# Patient Record
Sex: Male | Born: 1960 | Race: White | Hispanic: No | Marital: Married | Smoking: Never smoker
Health system: Southern US, Community
[De-identification: ages and names within clinical notes are randomized; demographics above are authoritative.]

## PROBLEM LIST (undated history)

## (undated) DIAGNOSIS — K219 Gastro-esophageal reflux disease without esophagitis: Secondary | ICD-10-CM

## (undated) HISTORY — DX: Gastro-esophageal reflux disease without esophagitis: K21.9

## (undated) HISTORY — PX: KNEE ARTHROSCOPY: SUR90

---

## 2000-12-01 ENCOUNTER — Ambulatory Visit (HOSPITAL_COMMUNITY): Admission: RE | Admit: 2000-12-01 | Discharge: 2000-12-01 | Payer: Self-pay

## 2010-09-14 LAB — HM COLONOSCOPY

## 2014-02-10 ENCOUNTER — Ambulatory Visit: Payer: Self-pay | Admitting: Internal Medicine

## 2014-02-10 ENCOUNTER — Telehealth: Payer: Self-pay | Admitting: *Deleted

## 2014-02-10 NOTE — Telephone Encounter (Signed)
Pt did not show for appointment 02/10/2014 at 3pm for new patient transfer, family member sees Dr. Beverely Lowabori

## 2014-02-10 NOTE — Telephone Encounter (Signed)
Please reschedule if patient agreeable

## 2014-02-18 NOTE — Telephone Encounter (Signed)
Pt has rescheduled new patient appointment with Drue NovelPaz 03/04/2014

## 2014-03-04 ENCOUNTER — Ambulatory Visit: Payer: Self-pay | Admitting: Internal Medicine

## 2014-03-31 ENCOUNTER — Ambulatory Visit: Payer: Self-pay | Admitting: Internal Medicine

## 2014-04-10 ENCOUNTER — Ambulatory Visit (INDEPENDENT_AMBULATORY_CARE_PROVIDER_SITE_OTHER): Payer: 59 | Admitting: Internal Medicine

## 2014-04-10 ENCOUNTER — Ambulatory Visit: Payer: Self-pay | Admitting: Internal Medicine

## 2014-04-10 ENCOUNTER — Encounter: Payer: Self-pay | Admitting: Internal Medicine

## 2014-04-10 ENCOUNTER — Ambulatory Visit (INDEPENDENT_AMBULATORY_CARE_PROVIDER_SITE_OTHER): Payer: 59

## 2014-04-10 VITALS — BP 129/74 | HR 57 | Temp 98.1°F | Ht 74.0 in | Wt 254.0 lb

## 2014-04-10 DIAGNOSIS — L989 Disorder of the skin and subcutaneous tissue, unspecified: Secondary | ICD-10-CM

## 2014-04-10 DIAGNOSIS — K219 Gastro-esophageal reflux disease without esophagitis: Secondary | ICD-10-CM | POA: Insufficient documentation

## 2014-04-10 DIAGNOSIS — Z0000 Encounter for general adult medical examination without abnormal findings: Secondary | ICD-10-CM | POA: Insufficient documentation

## 2014-04-10 DIAGNOSIS — R002 Palpitations: Secondary | ICD-10-CM

## 2014-04-10 DIAGNOSIS — Z23 Encounter for immunization: Secondary | ICD-10-CM

## 2014-04-10 NOTE — Assessment & Plan Note (Addendum)
Td 2013 Flu shot today  Reports a colonoscopy 2014, asked patient to bring records DRE normal, check a PSA Diet and exercise discussed  Other issues: Skin lesions left ear, refer to dermatology

## 2014-04-10 NOTE — Progress Notes (Signed)
Pre visit review using our clinic review tool, if applicable. No additional management support is needed unless otherwise documented below in the visit note. 

## 2014-04-10 NOTE — Patient Instructions (Addendum)
Stop by the front desk and schedule labs to be done within few days (fasting) Please request an EKG to be done at the time of her lab appointment.  Please come back to the office in 3 months  for a routine check up   Will wait for the records from her previous doctors.  If the palpitations are intense, frequent or severe, call the office or go to the ER.

## 2014-04-10 NOTE — Assessment & Plan Note (Addendum)
Occasional episodes of palpitations and anxiety, not other symptoms. Pt believes the trigger is anxiety as that starts the episodes EKG--unavailable today due to a machine malfunction, asked to come back next week, Return to the office 3 months, call if symptoms severe

## 2014-04-10 NOTE — Assessment & Plan Note (Signed)
Currently on Nexium, works better than Prilosec. Reports a EGD 2014, will get records.

## 2014-04-10 NOTE — Progress Notes (Signed)
   Subjective:    Patient ID: Russell Mack, male    DOB: 1961/03/25, 54 y.o.   MRN: [id number]  DOS:  04/10/2014 Type of visit - description : New patient, transferring from Dr. Jeanie Seweredding, physical exam. Interval history: In general feeling well. Has a skin lesion of the left ear that is not healing.   ROS Denies chest pain or difficulty breathing No nausea, vomiting, diarrhea or blood in the stools GERD symptoms well controlled, no dysphagia or odynophagia. Over the last year from time to time he gets really anxious, symptoms are at rest usually, associated with palpitations but no other symptoms such as chest pain. No depression No dysuria, gross hematuria difficulty urinating   Past Medical History  Diagnosis Date  . GERD (gastroesophageal reflux disease)     History reviewed. No pertinent past surgical history.  History   Social History  . Marital Status: Married    Spouse Name: N/A    Number of Children: N/A  . Years of Education: N/A   Occupational History  . Not on file.   Social History Main Topics  . Smoking status: Never Smoker   . Smokeless tobacco: Not on file  . Alcohol Use: No  . Drug Use: No  . Sexual Activity: Not on file   Other Topics Concern  . Not on file   Social History Narrative  . No narrative on file     Family History  Problem Relation Age of Onset  . Diabetes Mother     mother, 2 sisters, aunts   . CAD Mother     CABG at age ~ 8160s  . CAD Sister     CABG, ~late 8850s  . Colon cancer Neg Hx   . Prostate cancer Neg Hx        Medication List       This list is accurate as of: 04/10/14 10:38 AM.  Always use your most recent med list.               NEXIUM PO  Take 1 tablet by mouth daily.           Objective:   Physical Exam BP 129/74 mmHg  Pulse 57  Temp(Src) 98.1 F (36.7 C) (Oral)  Ht 6\' 2"  (1.88 m)  Wt 254 lb (115.214 kg)  BMI 32.60 kg/m2  SpO2 98% General -- alert, well-developed, NAD.  Neck --no thyromegaly  , normal carotid pulse  HEENT-- Not pale.  Left ear, has had 2 mm scaly lesion TMs wnl  Lungs -- normal respiratory effort, no intercostal retractions, no accessory muscle use, and normal breath sounds.  Heart-- normal rate, regular rhythm, no murmur.  Abdomen-- Not distended, good bowel sounds,soft, non-tender. No rebound or rigidity.   Rectal-- No external abnormalities noted. Normal sphincter tone. No rectal masses or tenderness. Stool brown  Prostate--Prostate gland firm and smooth, no enlargement, nodularity, tenderness, mass, asymmetry or induration. Extremities-- no pretibial edema bilaterally  Neurologic--  alert & oriented X3. Speech normal, gait appropriate for age, strength symmetric and appropriate for age.  Psych-- Cognition and judgment appear intact. Cooperative with normal attention span and concentration. No anxious or depressed appearing.        Assessment & Plan:

## 2014-04-11 ENCOUNTER — Other Ambulatory Visit (INDEPENDENT_AMBULATORY_CARE_PROVIDER_SITE_OTHER): Payer: 59

## 2014-04-11 DIAGNOSIS — Z0000 Encounter for general adult medical examination without abnormal findings: Secondary | ICD-10-CM

## 2014-04-11 LAB — CBC WITH DIFFERENTIAL/PLATELET
Basophils Absolute: 0 10*3/uL (ref 0.0–0.1)
Basophils Relative: 0.4 % (ref 0.0–3.0)
Eosinophils Absolute: 0.1 10*3/uL (ref 0.0–0.7)
Eosinophils Relative: 1.3 % (ref 0.0–5.0)
HCT: 46.9 % (ref 39.0–52.0)
Hemoglobin: 15.4 g/dL (ref 13.0–17.0)
Lymphocytes Relative: 19.2 % (ref 12.0–46.0)
Lymphs Abs: 1.1 10*3/uL (ref 0.7–4.0)
MCHC: 33 g/dL (ref 30.0–36.0)
MCV: 91.2 fl (ref 78.0–100.0)
Monocytes Absolute: 0.6 10*3/uL (ref 0.1–1.0)
Monocytes Relative: 9.5 % (ref 3.0–12.0)
Neutro Abs: 4.1 10*3/uL (ref 1.4–7.7)
Neutrophils Relative %: 69.6 % (ref 43.0–77.0)
Platelets: 194 10*3/uL (ref 150.0–400.0)
RBC: 5.14 Mil/uL (ref 4.22–5.81)
RDW: 13.2 % (ref 11.5–15.5)
WBC: 5.9 10*3/uL (ref 4.0–10.5)

## 2014-04-11 LAB — COMPREHENSIVE METABOLIC PANEL
ALT: 31 U/L (ref 0–53)
AST: 22 U/L (ref 0–37)
Albumin: 3.9 g/dL (ref 3.5–5.2)
Alkaline Phosphatase: 93 U/L (ref 39–117)
BUN: 17 mg/dL (ref 6–23)
CO2: 25 mEq/L (ref 19–32)
Calcium: 8.8 mg/dL (ref 8.4–10.5)
Chloride: 107 mEq/L (ref 96–112)
Creatinine, Ser: 0.7 mg/dL (ref 0.4–1.5)
GFR: 116.38 mL/min (ref >60.00)
Glucose, Bld: 105 mg/dL — ABNORMAL HIGH (ref 70–99)
Potassium: 4.1 mEq/L (ref 3.5–5.1)
Sodium: 138 mEq/L (ref 135–145)
Total Bilirubin: 0.7 mg/dL (ref 0.2–1.2)
Total Protein: 6.7 g/dL (ref 6.0–8.3)

## 2014-04-11 LAB — TSH: TSH: 0.8 u[IU]/mL (ref 0.35–4.50)

## 2014-04-11 LAB — LIPID PANEL
Cholesterol: 179 mg/dL (ref 0–200)
HDL: 38.6 mg/dL — ABNORMAL LOW (ref >39.00)
LDL Cholesterol: 124 mg/dL — ABNORMAL HIGH (ref 0–99)
NonHDL: 140.4
Total CHOL/HDL Ratio: 5
Triglycerides: 84 mg/dL (ref 0.0–149.0)
VLDL: 16.8 mg/dL (ref 0.0–40.0)

## 2014-04-11 LAB — PSA: PSA: 1.78 ng/mL (ref 0.10–4.00)

## 2014-04-19 ENCOUNTER — Encounter: Payer: Self-pay | Admitting: Internal Medicine

## 2014-04-23 ENCOUNTER — Encounter: Payer: Self-pay | Admitting: Internal Medicine

## 2014-04-23 ENCOUNTER — Ambulatory Visit (INDEPENDENT_AMBULATORY_CARE_PROVIDER_SITE_OTHER): Payer: 59 | Admitting: Internal Medicine

## 2014-04-23 VITALS — BP 110/70 | HR 63 | Temp 98.1°F | Ht 74.0 in | Wt 233.4 lb

## 2014-04-23 DIAGNOSIS — Z8249 Family history of ischemic heart disease and other diseases of the circulatory system: Secondary | ICD-10-CM

## 2014-04-23 DIAGNOSIS — F419 Anxiety disorder, unspecified: Secondary | ICD-10-CM

## 2014-04-23 DIAGNOSIS — R002 Palpitations: Secondary | ICD-10-CM

## 2014-04-23 MED ORDER — ESCITALOPRAM OXALATE 10 MG PO TABS: 10.0000 mg | ORAL_TABLET | Freq: Every day | ORAL | Status: DC

## 2014-04-23 MED ORDER — ATORVASTATIN CALCIUM 20 MG PO TABS: 20.0000 mg | ORAL_TABLET | Freq: Every day | ORAL | Status: DC

## 2014-04-23 NOTE — Assessment & Plan Note (Addendum)
Patient presents today for a EKG but is noted to be very anxious regards his family history of CAD,. On further questioning, he has been anxious for a while, his wife believes that he "needs something" because he is very irritable and "short fuse". I suspect he has GAD, chronic. We talk about treatment modalities including as needed medication, counseling and daily medication. He does not like to take anything that "could be addictive". Plan: We agreed on  Lexapro, watch for side effects, come back in 2 months.

## 2014-04-23 NOTE — Assessment & Plan Note (Signed)
The patient has a strong family history heart disease: his parents and  3 out of the 5 siblings had CABGs, one sibling had a stent, all  in their 4750s. The patient's 89, has no known CAD. Plan: Will be more aggressive in controlling cardiovascular risk factors, start aspirin, start Lipitor; last LDL was 124, will set a goal of 100 or less. Check a stress test. F/u 2 months

## 2014-04-23 NOTE — Patient Instructions (Signed)
Start taking an aspirin every morning Take Lipitor at bedtime for cholesterol They Lexapro at bedtime for anxiety  Come back in 2 months, fasting for a checkup.  Please call if you have side effects

## 2014-04-23 NOTE — Progress Notes (Signed)
   Subjective:    Patient ID: Russell Mack, male    DOB: [birth year], 54 y.o.   MRN: [id number]  DOS:  04/23/2014 Type of visit - description : Here for a EKG Interval history: Patient was here for EKG but he was noted to be  extremely anxious. His sister had a CABG yesterday, she is 4958.   the patient is even more concerned now about his family history of heart disease. See assessment and plan.    ROS Denies chest pain, still has occasional palpitations No lower extremity edema, dizziness or sweats  Past Medical History  Diagnosis Date  . GERD (gastroesophageal reflux disease)     Past Surgical History  Procedure Laterality Date  . Knee arthroscopy  remotely, side ?    History   Social History  . Marital Status: Married    Spouse Name: N/A    Number of Children: 2  . Years of Education: N/A   Occupational History  . foreman    Social History Main Topics  . Smoking status: Never Smoker   . Smokeless tobacco: Never Used  . Alcohol Use: No  . Drug Use: No  . Sexual Activity: Not on file   Other Topics Concern  . Not on file   Social History Narrative   2 adult children, 2 G-kids        Medication List       This list is accurate as of: 04/23/14  3:16 PM.  Always use your most recent med list.               aspirin 81 MG tablet  Take 81 mg by mouth daily.     atorvastatin 20 MG tablet  Commonly known as:  LIPITOR  Take 1 tablet (20 mg total) by mouth daily.     escitalopram 10 MG tablet  Commonly known as:  LEXAPRO  Take 1 tablet (10 mg total) by mouth daily.     NEXIUM PO  Take 1 tablet by mouth daily.           Objective:   Physical Exam BP 110/70 mmHg  Pulse 63  Temp(Src) 98.1 F (36.7 C) (Oral)  Ht 6\' 2"  (1.88 m)  Wt 233 lb 6 oz (105.858 kg)  BMI 29.95 kg/m2  SpO2 97%  General -- alert, well-developed, NAD.  Lungs -- normal respiratory effort, no intercostal retractions, no accessory muscle use, and normal breath sounds.    Heart-- normal rate, regular rhythm, no murmur. Extremities-- no pretibial edema bilaterally  Neurologic--  alert & oriented X3. Speech normal, gait appropriate for age, strength symmetric and appropriate for age.  Psych-- Cognition and judgment appear intact. Cooperative with normal attention span and concentration. Very  anxious or depressed appearing.        Assessment & Plan:

## 2014-04-23 NOTE — Progress Notes (Signed)
Pre visit review using our clinic review tool, if applicable. No additional management support is needed unless otherwise documented below in the visit note. 

## 2014-07-10 ENCOUNTER — Ambulatory Visit: Payer: 59 | Admitting: Internal Medicine

## 2014-07-25 ENCOUNTER — Ambulatory Visit: Payer: 59 | Admitting: Internal Medicine

## 2014-09-03 ENCOUNTER — Other Ambulatory Visit: Payer: Self-pay | Admitting: Internal Medicine

## 2014-09-16 ENCOUNTER — Other Ambulatory Visit: Payer: Self-pay | Admitting: Internal Medicine

## 2014-10-01 ENCOUNTER — Telehealth: Payer: Self-pay | Admitting: Internal Medicine

## 2014-10-01 MED ORDER — ATORVASTATIN CALCIUM 20 MG PO TABS: 20.0000 mg | ORAL_TABLET | Freq: Every day | ORAL | Status: DC

## 2014-10-01 MED ORDER — ESCITALOPRAM OXALATE 10 MG PO TABS: 10.0000 mg | ORAL_TABLET | Freq: Every day | ORAL | Status: DC

## 2014-10-01 NOTE — Telephone Encounter (Signed)
[facility]. #30 tabs and 1 refill.

## 2014-10-01 NOTE — Telephone Encounter (Signed)
Caller name: Noland Fordyceonya Austill Relationship to patient: self Can be reached: [phone] [facility] [place]  Reason for call: Pt needing refills of Lexapro & Lipitor. Insurance company is requiring all meds sent to [facility] now. He has a few days of one med and about 2 weeks of the other. Appt scheduled for 10/15/14. Was due for appt in March.

## 2014-10-15 ENCOUNTER — Ambulatory Visit (INDEPENDENT_AMBULATORY_CARE_PROVIDER_SITE_OTHER): Payer: 59 | Admitting: Internal Medicine

## 2014-10-15 ENCOUNTER — Encounter: Payer: Self-pay | Admitting: Internal Medicine

## 2014-10-15 VITALS — BP 130/84 | HR 56 | Temp 97.5°F | Ht 74.0 in | Wt 230.0 lb

## 2014-10-15 DIAGNOSIS — R002 Palpitations: Secondary | ICD-10-CM

## 2014-10-15 DIAGNOSIS — E785 Hyperlipidemia, unspecified: Secondary | ICD-10-CM | POA: Diagnosis not present

## 2014-10-15 DIAGNOSIS — F419 Anxiety disorder, unspecified: Secondary | ICD-10-CM | POA: Diagnosis not present

## 2014-10-15 DIAGNOSIS — R739 Hyperglycemia, unspecified: Secondary | ICD-10-CM

## 2014-10-15 MED ORDER — ESCITALOPRAM OXALATE 10 MG PO TABS: 10.0000 mg | ORAL_TABLET | Freq: Every day | ORAL | Status: DC

## 2014-10-15 MED ORDER — ATORVASTATIN CALCIUM 20 MG PO TABS: 20.0000 mg | ORAL_TABLET | Freq: Every day | ORAL | Status: DC

## 2014-10-15 NOTE — Progress Notes (Signed)
   Subjective:    Patient ID: Russell Mack, male    DOB: [birth year], 54 y.o.   MRN: [id number]  DOS:  10/15/2014 Type of visit - description : Routine office visit Interval history: Anxiety, started Lexapro, feeling great; " it is a different world" High cholesterol, on Lipitor, good compliance, no apparent side effects. Continue with palpitations, on and off, about one episode a week, few times he felt presyncopal. No associated chest pain or diaphoresis. No nausea.   Review of Systems   Past Medical History  Diagnosis Date  . GERD (gastroesophageal reflux disease)     Past Surgical History  Procedure Laterality Date  . Knee arthroscopy  remotely, side ?    History   Social History  . Marital Status: Married    Spouse Name: N/A  . Number of Children: 2  . Years of Education: N/A   Occupational History  . foreman    Social History Main Topics  . Smoking status: Never Smoker   . Smokeless tobacco: Never Used  . Alcohol Use: No  . Drug Use: No  . Sexual Activity: Not on file   Other Topics Concern  . Not on file   Social History Narrative   2 adult children, 2 G-kids        Medication List       This list is accurate as of: 10/15/14 11:59 PM.  Always use your most recent med list.               aspirin 81 MG tablet  Take 81 mg by mouth daily.     atorvastatin 20 MG tablet  Commonly known as:  LIPITOR  Take 1 tablet (20 mg total) by mouth daily.     escitalopram 10 MG tablet  Commonly known as:  LEXAPRO  Take 1 tablet (10 mg total) by mouth daily.     NEXIUM PO  Take 1 tablet by mouth daily.           Objective:   Physical Exam BP 130/84 mmHg  Pulse 56  Temp(Src) 97.5 F (36.4 C) (Oral)  Ht 6\' 2"  (1.88 m)  Wt 230 lb (104.327 kg)  BMI 29.52 kg/m2  SpO2 98% General:   Well developed, well nourished . NAD.  HEENT:  Normocephalic . Face symmetric, atraumatic Lungs:  CTA B Normal respiratory effort, no intercostal retractions, no  accessory muscle use. Heart: RRR,  no murmur.  no pretibial edema bilaterally  Abdomen:  Not distended, soft, non-tender. No rebound or rigidity. No mass,organomegaly Skin: Not pale. Not jaundice Neurologic:  alert & oriented X3.  Speech normal, gait appropriate for age and unassisted Psych--  Cognition and judgment appear intact.  Cooperative with normal attention span and concentration.  Behavior appropriate. No anxious or depressed appearing.       Assessment & Plan:

## 2014-10-15 NOTE — Progress Notes (Signed)
Pre visit review using our clinic review tool, if applicable. No additional management support is needed unless otherwise documented below in the visit note. 

## 2014-10-15 NOTE — Patient Instructions (Addendum)
  Please schedule labs to be done within few days (fasting)   

## 2014-10-15 NOTE — Assessment & Plan Note (Addendum)
Excellent results w/ Lexapro, no apparent side effects. Recommend to stay on Lexapro, refill medications, reassess the need to stay on SSRIs yearly

## 2014-10-15 NOTE — Assessment & Plan Note (Signed)
Despite treatment of anxiety, he continue with episode of palpitation, sometimes associated with a presyncopal feeling. Plan: Refer to cardiology, further eval?

## 2014-10-15 NOTE — Assessment & Plan Note (Addendum)
Based on the last cholesterol panel and his  family history of heart disease the patient started Lipitor, good compliance and tolerance. Plan: Recheck FLP, AST ALT, refill Lipitor. Will also check a A1c

## 2014-10-17 ENCOUNTER — Other Ambulatory Visit (INDEPENDENT_AMBULATORY_CARE_PROVIDER_SITE_OTHER): Payer: 59

## 2014-10-17 DIAGNOSIS — R739 Hyperglycemia, unspecified: Secondary | ICD-10-CM | POA: Diagnosis not present

## 2014-10-17 DIAGNOSIS — E785 Hyperlipidemia, unspecified: Secondary | ICD-10-CM

## 2014-10-17 LAB — ALT: ALT: 42 U/L (ref 0–53)

## 2014-10-17 LAB — LIPID PANEL
Cholesterol: 137 mg/dL (ref 0–200)
HDL: 57.2 mg/dL (ref >39.00)
LDL Cholesterol: 67 mg/dL (ref 0–99)
NonHDL: 79.8
Total CHOL/HDL Ratio: 2
Triglycerides: 65 mg/dL (ref 0.0–149.0)
VLDL: 13 mg/dL (ref 0.0–40.0)

## 2014-10-17 LAB — AST: AST: 25 U/L (ref 0–37)

## 2014-10-17 LAB — HEMOGLOBIN A1C: HEMOGLOBIN A1C: 6 % (ref 4.6–6.5)

## 2014-10-23 ENCOUNTER — Telehealth: Payer: Self-pay | Admitting: Internal Medicine

## 2014-10-23 NOTE — Telephone Encounter (Signed)
Caller name: [place], [place] Relation to pt:  Spouse  Call back number: [phone]   Reason for call:  Patient requesting lab results

## 2014-10-23 NOTE — Telephone Encounter (Signed)
Spoke with [name], Pt's wife, informed her that labs were placed in mail on 10/20/2014 that they should receive them soon, but that all labs were normal. 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 I informed her that the aspirin daily that he takes may be causing the bleeding when he gets cut. I instructed her to have Pt try an aspirin every other day and see if that helps, I told her if she or the Pt  doesn't notice any improvement, he will need to be seen. Tonya verbalized understanding.

## 2014-12-04 ENCOUNTER — Encounter: Payer: Self-pay | Admitting: Cardiovascular Disease

## 2014-12-04 ENCOUNTER — Ambulatory Visit (INDEPENDENT_AMBULATORY_CARE_PROVIDER_SITE_OTHER): Payer: 59 | Admitting: Cardiovascular Disease

## 2014-12-04 ENCOUNTER — Ambulatory Visit (INDEPENDENT_AMBULATORY_CARE_PROVIDER_SITE_OTHER)
Admission: RE | Admit: 2014-12-04 | Discharge: 2014-12-04 | Disposition: A | Discharge status: Home / Self Care | Payer: 59 | Source: Ambulatory Visit | Attending: Cardiovascular Disease | Admitting: Cardiovascular Disease

## 2014-12-04 VITALS — BP 132/72 | HR 81 | Ht 74.0 in | Wt 232.4 lb

## 2014-12-04 DIAGNOSIS — R002 Palpitations: Secondary | ICD-10-CM

## 2014-12-04 NOTE — Patient Instructions (Signed)
Medication Instructions:  NO  CHANGES  Labwork: NONE  Testing/Procedures: Your physician has requested that you have an exercise tolerance test. For further information please visit [email or web address] Please also follow instruction sheet, as given.   CALCIUM SCORE   OUT OF POCKET $150.00   TODAY   Follow-Up: Your physician recommends that you schedule a follow-up appointment in: AS NEEDED  Any Other Special Instructions Will Be Listed Below (If Applicable).

## 2014-12-04 NOTE — Progress Notes (Signed)
Patient ID: Russell Mack, male   DOB: 1960/11/30, 54 y.o.   MRN: [id number]     Cardiology Office Note   Date:  12/04/2014   ID:  Russell Mack, DOB 27-May-1960, MRN [id number]  PCP:  Willow Ora, MD  Cardiologist:   Charlton Haws, MD   No chief complaint on file.     History of Present Illness: Russell Mack is a 54 y.o. male who presents for evaluation of palpitations  These have been going on for a few months.  They seem a reaction to his concern about Premature family history of CAD.  Palpitations last seconds are not related to exertion and resolve spontaneously.  Occur weekly.  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 No real SSCP.  No dyspnea  Sedentary outside of work Compliant with meds including  Statin for cholesterol  Has not had stress testing In past     Past Medical History  Diagnosis Date  . GERD (gastroesophageal reflux disease)     Past Surgical History  Procedure Laterality Date  . Knee arthroscopy  remotely, side ?     Current Outpatient Prescriptions  Medication Sig Dispense Refill  . aspirin 81 MG tablet Take 81 mg by mouth daily.    Marland Kitchen atorvastatin (LIPITOR) 20 MG tablet Take 1 tablet (20 mg total) by mouth daily. 30 tablet 8  . escitalopram (LEXAPRO) 10 MG tablet Take 1 tablet (10 mg total) by mouth daily. 30 tablet 8  . Esomeprazole Magnesium (NEXIUM PO) Take 1 tablet by mouth daily.     No current facility-administered medications for this visit.    Allergies:   Review of patient's allergies indicates no known allergies.    Social History:  The patient  reports that he has never smoked. He has never used smokeless tobacco. He reports that he does not drink alcohol or use illicit drugs.   Family History:  The patient's family history includes CAD in his mother and sister; Diabetes in his mother. There is no history of Colon cancer or Prostate cancer.    ROS:  Please see the history of present illness.   Otherwise,  review of systems are positive for none.   All other systems are reviewed and negative.    PHYSICAL EXAM: VS:  There were no vitals taken for this visit. , BMI There is no weight on file to calculate BMI. Affect appropriate Healthy:  appears stated age HEENT: normal Neck supple with no adenopathy JVP normal no bruits no thyromegaly Lungs clear with no wheezing and good diaphragmatic motion Heart:  S1/S2 no murmur, no rub, gallop or click PMI normal Abdomen: benighn, BS positve, no tenderness, no AAA no bruit.  No HSM or HJR Distal pulses intact with no bruits No edema Neuro non-focal Skin warm and dry No muscular weakness    EKG:   1.29.16   SR rate 61 normal ECG    Recent Labs: 04/11/2014: BUN 17; Creatinine, Ser 0.7; Hemoglobin 15.4; Platelets 194.0; Potassium 4.1; Sodium 138; TSH 0.80 10/17/2014: ALT 42    Lipid Panel    Component Value Date/Time   CHOL 137 10/17/2014 0905   TRIG 65.0 10/17/2014 0905   HDL 57.20 10/17/2014 0905   CHOLHDL 2 10/17/2014 0905   VLDL 13.0 10/17/2014 0905   LDLCALC 67 10/17/2014 0905      Wt Readings from Last 3 Encounters:  10/15/14 104.327 kg (230 lb)  04/23/14 105.858 kg (233 lb 6 oz)  04/10/14 115.214  kg (254 lb)      Other studies Reviewed: Additional studies/ records that were reviewed today include: Primary care notes PAZ and Epic notes including ECG.    ASSESSMENT AND PLAN: CAD:  Family history with elevated lipids.  Favor calcium score and ETT to risk stratify. Chol:  On statin  Lab Results  Component Value Date   LDLCALC 67 10/17/2014   Palpitations:  Sound benign and related to anxiety Normal exam and ECG  No further w/u if ETT and calcium score normal    Current medicines are reviewed at length with the patient today.  The patient does not have concerns regarding medicines.  The following changes have been made:  no change  Labs/ tests ordered today include: Calcium Score and ETT  No orders of the defined  types were placed in this encounter.     Disposition:   FU with me PRN if tests normal      Signed, Charlton Haws, MD  12/04/2014 10:43 AM    [facility] [address] [place], [place], [place]  [address] Phone: [phone]; Fax: [phone]

## 2014-12-16 ENCOUNTER — Telehealth (HOSPITAL_COMMUNITY): Payer: Self-pay

## 2014-12-16 NOTE — Telephone Encounter (Signed)
Encounter complete. 

## 2014-12-18 ENCOUNTER — Ambulatory Visit (HOSPITAL_COMMUNITY)
Admission: RE | Admit: 2014-12-18 | Discharge: 2014-12-18 | Disposition: A | Discharge status: Home / Self Care | Payer: 59 | Source: Ambulatory Visit | Attending: Cardiology | Admitting: Cardiology

## 2014-12-18 DIAGNOSIS — R002 Palpitations: Secondary | ICD-10-CM

## 2014-12-19 LAB — EXERCISE TOLERANCE TEST
CHL CUP MPHR: 164 {beats}/min
CHL CUP RESTING HR STRESS: 65 {beats}/min
CHL CUP STRESS STAGE 1 DBP: 75 mmHg
CHL CUP STRESS STAGE 1 SBP: 141 mmHg
CHL CUP STRESS STAGE 2 GRADE: 0 %
CHL CUP STRESS STAGE 4 GRADE: 10 %
CHL CUP STRESS STAGE 4 HR: 102 {beats}/min
CHL CUP STRESS STAGE 5 DBP: 67 mmHg
CHL CUP STRESS STAGE 5 SBP: 160 mmHg
CHL CUP STRESS STAGE 6 HR: 153 {beats}/min
CHL CUP STRESS STAGE 6 SBP: 157 mmHg
CHL CUP STRESS STAGE 7 SPEED: 3.4 mph
CHL CUP STRESS STAGE 8 GRADE: 0 %
CHL CUP STRESS STAGE 9 GRADE: 0 %
CHL CUP STRESS STAGE 9 SPEED: 0 mph
CHL RATE OF PERCEIVED EXERTION: 16
CSEPED: 9 min
CSEPEW: 10.1 METS
CSEPPHR: 153 {beats}/min
CSEPPMHR: 93 %
Percent HR: 94 %
Stage 1 Grade: 0 %
Stage 1 HR: 68 {beats}/min
Stage 1 Speed: 0 mph
Stage 2 HR: 69 {beats}/min
Stage 2 Speed: 1 mph
Stage 3 Grade: 0.1 %
Stage 3 HR: 69 {beats}/min
Stage 3 Speed: 1 mph
Stage 4 DBP: 71 mmHg
Stage 4 SBP: 129 mmHg
Stage 4 Speed: 1.7 mph
Stage 5 Grade: 12 %
Stage 5 HR: 127 {beats}/min
Stage 5 Speed: 2.5 mph
Stage 6 DBP: 67 mmHg
Stage 6 Grade: 14 %
Stage 6 Speed: 3.4 mph
Stage 7 Grade: 14 %
Stage 7 HR: 153 {beats}/min
Stage 8 DBP: 66 mmHg
Stage 8 HR: 130 {beats}/min
Stage 8 SBP: 125 mmHg
Stage 8 Speed: 0 mph
Stage 9 HR: 99 {beats}/min

## 2014-12-22 ENCOUNTER — Telehealth: Payer: Self-pay | Admitting: *Deleted

## 2014-12-22 NOTE — Telephone Encounter (Signed)
Cori Razor >','<< Less Detail',event)" href="javascript:;">More Detail >>   Alois Cliche, LPN   Sent: Mon December 22, 2014 10:17 AM    To: Alois Cliche, LPN         Alben Jepsen  Exercise Tolerance Test  Order# [id number]   Reading physician: Marykay Lex, MD  Ordering physician: Wendall Stade, MD  Study date: 12/18/14         Patient Information     Name MRN    Russell Mack [id number]    Description: 54 year old Male        Result Notes     Notes Recorded by Alois Cliche, LPN on 7/82/9562 at 9:04 AM LM TO CALL BACK ./CY ------  Notes Recorded by Wendall Stade, MD on 12/21/2014 at 4:47 PM ETT with upsloping ST changes low risk Calcium score low f/u with me after all tests done            Vitals     Height Weight BMI (Calculated)     (1.88 m) 232 lb 6.4 oz (105.416 kg) 29.9        Study Highlights      Negative TM GXT - LOW RISK  Upsloping ST segment depression ST segment depression of 2.5 mm was noted during stress in the II, III and aVF leads, beginning at 9 minutes of stress, and returning to baseline after less than 1 minute of recovery.  No T wave inversion was noted during stress.        Stress Findings     ECG Baseline ECG is normal.TWI in aVR & aVL .    Stress Findings The patient exercised following the Bruce protocol.  The patient reported shortness of breath during the stress test. Leg Fatigue The patient experienced no angina during the stress test.   Leg Fatigue   Blood pressure and heart rate demonstrated a normal response to exercise. Overall, the patient's exercise capacity was normal.   85% of maximum heart rate was achieved after 7 minutes. Recovery time: 6 minutes.  Duke Treadmill Score: low risk The patient's response to exercise was adequate for diagnosis.    Response to Stress Upsloping ST segment depression ST segment depression of 2.5 mm was noted during  stress in the II, III and aVF leads, beginning at 9 minutes of stress, and returning to baseline after less than 1 minute of recovery.  No T wave inversion was noted during stress. Arrhythmias during stress: none.  Arrhythmias during recovery: none.  There were no significant arrhythmias noted during the test.  ECG was interpretable.        Stress Measurements     Baseline Vitals  Rest HR 65 bpm     Rest BP 141/75 mmHg     Exercise Time  Exercise duration (min) 9 min    Peak Stress Vitals  Peak HR 153 BPM    Exercise Data  MPHR 164 bpm     Percent HR 94 %     RPE 16      Estimated workload 10.1 METS             Signed     Electronically signed by Marykay Lex, MD on 12/19/14 at 1852 EDT       Report approved and finalized on 12/19/2014 1852       Imaging     Imaging Information       Order-Level Documents - 12/18/2014:  Scan on 12/18/2014 4:29 PM by Provider Default, MDScan on 12/18/2014 4:29 PM by Provider Default, MD     Encounter-Level Documents - 12/18/2014:       Electronic signature on 12/18/2014 2:55 PM : [facility]/nl/tst     Electronic signature on 12/18/2014 2:54 PM : [facility]/nl/tst       Exam Information     Status Exam Begun   Exam Ended    Final [99] 12/18/2014 4:23 PM 12/18/2014    4:24 PM        External Result Report     External Result Report     Order   Exercise Tolerance Test [[id number]] (Order [id number])         Order Providers     Authorizing Encounter    Wendall Stade [facility]    Billing: Marykay Lex        Original Order     Ordered On Ordered By    12/04/2014 3:58 PM Alois Cliche, LPN        Associated Diagnoses       ICD-9-CM ICD-10-CM    Palpitations    785.1 R00.2        Order Questions     Question Answer    Where should this test be performed [facility])    Stress  with Dobutamine or Treadmill with exercise? Treadmill w/ exercise        Appointments for this Order     12/18/2014 3:30 PM - 60 min [facility] (Resource)    [facility]        Additional Information     Associated Adult nurse and Order Details    Collection Information      PT  [facility]WARE .Zack Seal

## 2015-04-21 ENCOUNTER — Encounter: Payer: 59 | Admitting: Internal Medicine

## 2015-09-24 ENCOUNTER — Other Ambulatory Visit: Payer: Self-pay | Admitting: Internal Medicine

## 2015-11-13 ENCOUNTER — Other Ambulatory Visit: Payer: Self-pay | Admitting: Internal Medicine

## 2015-11-13 ENCOUNTER — Telehealth: Payer: Self-pay | Admitting: Internal Medicine

## 2015-11-13 MED ORDER — ATORVASTATIN CALCIUM 20 MG PO TABS: 20.0000 mg | ORAL_TABLET | Freq: Every day | ORAL | 2 refills | Status: DC

## 2015-11-13 MED ORDER — ESCITALOPRAM OXALATE 10 MG PO TABS: 10.0000 mg | ORAL_TABLET | Freq: Every day | ORAL | 2 refills | Status: DC

## 2015-11-13 NOTE — Telephone Encounter (Signed)
Rx's sent. Pt has F/U scheduled 01/2016.

## 2015-11-13 NOTE — Telephone Encounter (Signed)
Caller name: Archie Pattenonya Relationship to patient: Wife Can be reached: [phone]  Pharmacy:  [facility] [address] - [place], [place] - [address] [phone] (Phone) [phone] (Fax)   Reason for call: Request refill on atorvastatin (LIPITOR) 20 MG tablet [[id number] and escitalopram (LEXAPRO) 10 MG tablet [[id number]][[id number]]

## 2016-01-05 ENCOUNTER — Ambulatory Visit: Payer: Self-pay | Admitting: Internal Medicine

## 2016-03-06 ENCOUNTER — Other Ambulatory Visit: Payer: Self-pay | Admitting: Internal Medicine

## 2016-03-15 ENCOUNTER — Telehealth: Payer: Self-pay | Admitting: Internal Medicine

## 2016-03-15 MED ORDER — ESCITALOPRAM OXALATE 10 MG PO TABS: 10.0000 mg | ORAL_TABLET | Freq: Every day | ORAL | 0 refills | Status: DC

## 2016-03-15 NOTE — Telephone Encounter (Signed)
Patient's spouse is calling to request a refill of escitalopram (LEXAPRO) 10 MG tablet Patient is completely out. Please advise   Pharmacy: [facility] [address], [place], [place] [id number]

## 2016-03-15 NOTE — Telephone Encounter (Signed)
LVM informing patient that a 15 day supply of medication was called into the pharmacy. In order to receive a larger quantity patient would need to schedule an appointment.

## 2016-03-15 NOTE — Telephone Encounter (Signed)
15 day supply sent. Pt has not been seen since 10/2014. Needs appt for further quantity and refills.

## 2016-03-29 ENCOUNTER — Ambulatory Visit (INDEPENDENT_AMBULATORY_CARE_PROVIDER_SITE_OTHER): Payer: Self-pay | Admitting: Internal Medicine

## 2016-03-29 ENCOUNTER — Encounter: Payer: Self-pay | Admitting: Internal Medicine

## 2016-03-29 VITALS — BP 116/66 | HR 61 | Temp 97.7°F | Resp 12 | Ht 74.0 in | Wt 253.0 lb

## 2016-03-29 DIAGNOSIS — E785 Hyperlipidemia, unspecified: Secondary | ICD-10-CM

## 2016-03-29 DIAGNOSIS — F419 Anxiety disorder, unspecified: Secondary | ICD-10-CM

## 2016-03-29 DIAGNOSIS — R918 Other nonspecific abnormal finding of lung field: Secondary | ICD-10-CM

## 2016-03-29 DIAGNOSIS — Z23 Encounter for immunization: Secondary | ICD-10-CM

## 2016-03-29 DIAGNOSIS — Z09 Encounter for follow-up examination after completed treatment for conditions other than malignant neoplasm: Secondary | ICD-10-CM | POA: Insufficient documentation

## 2016-03-29 MED ORDER — ESCITALOPRAM OXALATE 10 MG PO TABS: 10.0000 mg | ORAL_TABLET | Freq: Every day | ORAL | 5 refills | Status: DC

## 2016-03-29 MED ORDER — ATORVASTATIN CALCIUM 20 MG PO TABS: 20.0000 mg | ORAL_TABLET | Freq: Every day | ORAL | 5 refills | Status: DC

## 2016-03-29 NOTE — Assessment & Plan Note (Signed)
Anxiety-doing great with SSRIs, refill Lexapro for a year Dyslipidemia: Check a FLP, BMP, AST, ALT Pulmonary nodules: Incidental finding during a CT 2016, patient is asx, he was born in [place] and live in [place] most of his life. [place] not been in [place]. 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 We'll recheck a CT as recommended by radiology  flu shot today RTC at least once a year d/t the medications he takes (actually recommend a CPX at his convenience)

## 2016-03-29 NOTE — Progress Notes (Signed)
Pre visit review using our clinic review tool, if applicable. No additional management support is needed unless otherwise documented below in the visit note. 

## 2016-03-29 NOTE — Progress Notes (Signed)
Subjective:    Patient ID: Russell Mack, male    DOB: 14-Sep-1960, 55 y.o.   MRN: [id number]  DOS:  03/29/2016 Type of visit - description : rov Interval history: Anxiety: Symptoms under excellent control with Lexapro. Needed refills High cholesterol: Reports good compliance of medication without apparent side effects. Abnormal CT: Chart is reviewed, CT done 2016 was abnormal.   Review of Systems  Denies fever chills. No cough or weight loss No chest pain or difficulty breathing with ADLs. From time to time get short of breath with activities like going up stairs very quickly  Past Medical History:  Diagnosis Date  . GERD (gastroesophageal reflux disease)     Past Surgical History:  Procedure Laterality Date  . KNEE ARTHROSCOPY  remotely, side ?    Social History   Social History  . Marital status: Married    Spouse name: N/A  . Number of children: 2  . Years of education: N/A   Occupational History  . foreman    Social History Main Topics  . Smoking status: Never Smoker  . Smokeless tobacco: Never Used  . Alcohol use No  . Drug use: No  . Sexual activity: Not on file   Other Topics Concern  . Not on file   Social History Narrative   2 adult children, 2 G-kids      Allergies as of 03/29/2016   No Known Allergies     Medication List       Accurate as of 03/29/16  6:06 PM. Always use your most recent med list.          aspirin 81 MG tablet Take 81 mg by mouth daily.   atorvastatin 20 MG tablet Commonly known as:  LIPITOR Take 1 tablet (20 mg total) by mouth daily.   escitalopram 10 MG tablet Commonly known as:  LEXAPRO Take 1 tablet (10 mg total) by mouth daily.   NEXIUM PO Take 1 tablet by mouth daily.          Objective:   Physical Exam BP 116/66 (BP Location: Left Arm, Patient Position: Sitting, Cuff Size: Normal)   Pulse 61   Temp 97.7 F (36.5 C) (Oral)   Resp 12   Ht 6\' 2"  (1.88 m)   Wt 253 lb (114.8 kg)   SpO2 98%    BMI 32.48 kg/m  General:   Well developed, well nourished . NAD.  HEENT:  Normocephalic . Face symmetric, atraumatic  neck-no LADs Lungs:  CTA B Normal respiratory effort, no intercostal retractions, no accessory muscle use. Heart: RRR,  no murmur.  No pretibial edema bilaterally  Skin: Not pale. Not jaundice Neurologic:  alert & oriented X3.  Speech normal, gait appropriate for age and unassisted Psych--  Cognition and judgment appear intact.  Cooperative with normal attention span and concentration.  Behavior appropriate. No anxious or depressed appearing.      Assessment & Plan:   Assessment Anxiety-- started SSRIs 1- 2016 excellent response Dyslipidemia + FH CAD GERD Palpitations- saw cards, 12-2014: ETT low risk , had a Ca+ Coronary score Pulmonary nodules: Incidental finding during a calcium coronary scan (12-2014)  PLAN  Anxiety-doing great with SSRIs, refill Lexapro for a year Dyslipidemia: Check a FLP, BMP, AST, ALT Pulmonary nodules: Incidental finding during a CT 2016, patient is asx, he was born in [place] and live in [place] most of his life. [place] in [place] 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 We'll recheck a CT as recommended by radiology  flu shot today RTC at least once a year d/t the medications he takes (actually recommend a CPX at his convenience)

## 2016-03-29 NOTE — Patient Instructions (Signed)
   GO TO THE FRONT DESK Schedule labs to be done this week, fasting Schedule your next appointment for a  physical exam within a year

## 2016-03-30 ENCOUNTER — Other Ambulatory Visit (INDEPENDENT_AMBULATORY_CARE_PROVIDER_SITE_OTHER): Payer: Self-pay

## 2016-03-30 DIAGNOSIS — E785 Hyperlipidemia, unspecified: Secondary | ICD-10-CM

## 2016-03-31 LAB — BASIC METABOLIC PANEL
BUN: 21 mg/dL (ref 6–23)
CHLORIDE: 105 meq/L (ref 96–112)
CO2: 33 meq/L — AB (ref 19–32)
Calcium: 9 mg/dL (ref 8.4–10.5)
Creatinine, Ser: 0.95 mg/dL (ref 0.40–1.50)
GFR: 86.62 mL/min (ref >60.00)
Glucose, Bld: 115 mg/dL — ABNORMAL HIGH (ref 70–99)
POTASSIUM: 4.4 meq/L (ref 3.5–5.1)
Sodium: 140 mEq/L (ref 135–145)

## 2016-03-31 LAB — LIPID PANEL
Cholesterol: 129 mg/dL (ref 0–200)
HDL: 44.4 mg/dL (ref >39.00)
LDL Cholesterol: 55 mg/dL (ref 0–99)
NONHDL: 84.9
TRIGLYCERIDES: 151 mg/dL — AB (ref 0.0–149.0)
Total CHOL/HDL Ratio: 3
VLDL: 30.2 mg/dL (ref 0.0–40.0)

## 2016-03-31 LAB — ALT: ALT: 41 U/L (ref 0–53)

## 2016-03-31 LAB — AST: AST: 27 U/L (ref 0–37)

## 2016-04-02 ENCOUNTER — Encounter: Payer: Self-pay | Admitting: Internal Medicine

## 2016-04-05 ENCOUNTER — Ambulatory Visit (HOSPITAL_BASED_OUTPATIENT_CLINIC_OR_DEPARTMENT_OTHER)
Admission: RE | Admit: 2016-04-05 | Discharge: 2016-04-05 | Disposition: A | Discharge status: Home / Self Care | Payer: Self-pay | Source: Ambulatory Visit | Attending: Internal Medicine | Admitting: Internal Medicine

## 2016-04-05 DIAGNOSIS — N200 Calculus of kidney: Secondary | ICD-10-CM | POA: Insufficient documentation

## 2016-04-05 DIAGNOSIS — R918 Other nonspecific abnormal finding of lung field: Secondary | ICD-10-CM | POA: Insufficient documentation

## 2016-04-07 ENCOUNTER — Encounter: Payer: Self-pay | Admitting: Internal Medicine

## 2016-04-22 ENCOUNTER — Other Ambulatory Visit: Payer: Self-pay | Admitting: Internal Medicine

## 2016-10-31 ENCOUNTER — Other Ambulatory Visit: Payer: Self-pay | Admitting: Internal Medicine

## 2017-01-10 ENCOUNTER — Other Ambulatory Visit: Payer: Self-pay | Admitting: Internal Medicine

## 2017-01-17 ENCOUNTER — Telehealth: Payer: Self-pay | Admitting: Internal Medicine

## 2017-01-17 NOTE — Telephone Encounter (Signed)
Dr. Drue Novel does not do cortisone injections, however Dr. Carmelia Roller does.

## 2017-01-17 NOTE — Telephone Encounter (Signed)
Russell Mack Spouse [phone]  [name] called to say that [name] has a lot of shoulder pain and needs to be seen and thinks he may possible need Cortizone injection (this is what has worked in the past), they want to know if Dr Drue Novel does them, or if he needs to go somewhere else. He has no insurance at this time.

## 2017-01-18 NOTE — Telephone Encounter (Signed)
LVM to CB and schedule appointment with Dr Carmelia RollerWendling.

## 2017-02-15 ENCOUNTER — Other Ambulatory Visit: Payer: Self-pay | Admitting: Internal Medicine

## 2017-04-07 ENCOUNTER — Encounter: Payer: Self-pay | Admitting: Internal Medicine

## 2017-04-07 ENCOUNTER — Ambulatory Visit (INDEPENDENT_AMBULATORY_CARE_PROVIDER_SITE_OTHER): Payer: Self-pay | Admitting: Internal Medicine

## 2017-04-07 ENCOUNTER — Telehealth: Payer: Self-pay

## 2017-04-07 VITALS — BP 114/64 | HR 68 | Temp 97.4°F | Resp 14 | Ht 74.0 in | Wt 249.0 lb

## 2017-04-07 DIAGNOSIS — E785 Hyperlipidemia, unspecified: Secondary | ICD-10-CM

## 2017-04-07 DIAGNOSIS — Z8249 Family history of ischemic heart disease and other diseases of the circulatory system: Secondary | ICD-10-CM

## 2017-04-07 DIAGNOSIS — F419 Anxiety disorder, unspecified: Secondary | ICD-10-CM

## 2017-04-07 DIAGNOSIS — N218 Other lower urinary tract calculus: Secondary | ICD-10-CM

## 2017-04-07 DIAGNOSIS — Z23 Encounter for immunization: Secondary | ICD-10-CM

## 2017-04-07 DIAGNOSIS — R918 Other nonspecific abnormal finding of lung field: Secondary | ICD-10-CM

## 2017-04-07 MED ORDER — ESCITALOPRAM OXALATE 10 MG PO TABS: 10.0000 mg | ORAL_TABLET | Freq: Every day | ORAL | 3 refills | Status: DC

## 2017-04-07 MED ORDER — SILDENAFIL CITRATE 20 MG PO TABS: 60.0000 mg | ORAL_TABLET | Freq: Every evening | ORAL | 3 refills | Status: DC | PRN

## 2017-04-07 NOTE — Progress Notes (Signed)
Pre visit review using our clinic review tool, if applicable. No additional management support is needed unless otherwise documented below in the visit note. 

## 2017-04-07 NOTE — Progress Notes (Signed)
Subjective:    Patient ID: Russell Mack, male    DOB: 1960-10-11, 57 y.o.   MRN: [id number]  DOS:  04/07/2017 Type of visit - description : rov Interval history: Anxiety, on Lexapro, well control. High cholesterol: Self DC Lipitor many months ago due to joint aches, generalized pain. DJD: Has pain at the right shoulder and knee.  Sees orthopedics. ED: Having issues with poor erections lately, they do not last as much as before  Review of Systems  No fever chills, no weight loss. No chest pain or difficulty breathing.  No claudication No cough.  Past Medical History:  Diagnosis Date  . GERD (gastroesophageal reflux disease)     Past Surgical History:  Procedure Laterality Date  . KNEE ARTHROSCOPY  remotely, side ?    Social History   Socioeconomic History  . Marital status: Married    Spouse name: Not on file  . Number of children: 2  . Years of education: Not on file  . Highest education level: Not on file  Social Needs  . Financial resource strain: Not on file  . Food insecurity - worry: Not on file  . Food insecurity - inability: Not on file  . Transportation needs - medical: Not on file  . Transportation needs - non-medical: Not on file  Occupational History  . Occupation: foreman  Tobacco Use  . Smoking status: Never Smoker  . Smokeless tobacco: Never Used  Substance and Sexual Activity  . Alcohol use: No    Alcohol/week: 0.0 oz  . Drug use: No  . Sexual activity: Not on file  Other Topics Concern  . Not on file  Social History Narrative   2 adult children, 2 G-kids      Allergies as of 04/07/2017   No Known Allergies     Medication List        Accurate as of 04/07/17 11:59 PM. Always use your most recent med list.          aspirin 81 MG tablet Take 81 mg by mouth daily.   escitalopram 10 MG tablet Commonly known as:  LEXAPRO Take 1 tablet (10 mg total) by mouth daily.   NEXIUM PO Take 1 tablet by mouth daily.   sildenafil 20 MG  tablet Commonly known as:  REVATIO Take 3-4 tablets (60-80 mg total) by mouth at bedtime as needed.          Objective:   Physical Exam BP 114/64 (BP Location: Left Arm, Patient Position: Sitting, Cuff Size: Normal)   Pulse 68   Temp (!) 97.4 F (36.3 C) (Oral)   Resp 14   Ht 6\' 2"  (1.88 m)   Wt 249 lb (112.9 kg)   SpO2 97%   BMI 31.97 kg/m  General:   Well developed, well nourished . NAD.  HEENT:  Normocephalic . Face symmetric, atraumatic Lungs:  CTA B Normal respiratory effort, no intercostal retractions, no accessory muscle use. Heart: RRR,  no murmur.  No pretibial edema bilaterally  Skin: Not pale. Not jaundice Neurologic:  alert & oriented X3.  Speech normal, gait appropriate for age and unassisted Psych--  Cognition and judgment appear intact.  Cooperative with normal attention span and concentration.  Behavior appropriate. No anxious or depressed appearing.      Assessment & Plan:   Assessment Anxiety-- started SSRIs 1- 2016 excellent response Dyslipidemia: lipitor  self d/c d/t pain + FH CAD GERD Palpitations- saw cards, 12-2014: ETT low risk , had a Ca+  Coronary score Pulmonary nodules: - Incidental finding during a calcium coronary scan (04-6107(12-2014) -  CT 04/2016: Creatinine nodules, recommend additional CT in 1 year.  Incidentally question of cirrhosis and a 4 mm R renal stone  PLAN  Anxiety: Well-controlled on SSRIs, refill medications Dyslipidemia: Self DC Lipitor due to generalized aches and pains.  Extensive discussion about diet.  He is active. ED: New issue, multiple questions answered to the best of my ability, tips for healthy sexual life provided.  We will try sildenafil, side effects extensively discussed.  See instructions. Pulmonary Nodules: Due for CT, states he is unable to proceed due to cost, no insurance Question of cirrhosis on CT 04-2016: Liver tests have always been normal months, no risk factors.  Observation Kidney stone: At the  end of the visit he reported few months ago [facility] with right-sided abdominal pain, nausea.  Workup was done, diagnosed with a kidney stone, was given a strainer, never passed a stone but quickly become asymptomatic.  Will get records. Flu shot today. RTC, fasting, 3 - 4 month CPX.

## 2017-04-07 NOTE — Patient Instructions (Addendum)
GO TO THE FRONT DESK Schedule your next appointment for a physical exam fasting in 3 or 4 months  Sildenafil (Viagra): Take 3-4 tablets 1 hour before sexual activity. ([facility])   Visit the  [facility] web site to learn about heart disease, high cholesterol, eating healthy. [email or web address]   Also visit The American Heart Association, http://www.heart.org    Aspirin 81 mg  daily

## 2017-04-07 NOTE — Telephone Encounter (Signed)
ROI completed for [facility] and faxed to [phone]. Form sent for scanning. Awaiting records.

## 2017-04-09 NOTE — Assessment & Plan Note (Signed)
Anxiety: Well-controlled on SSRIs, refill medications Dyslipidemia: Self DC Lipitor due to generalized aches and pains.  Extensive discussion about diet.  He is active. ED: New issue, multiple questions answered to the best of my ability, tips for healthy sexual life provided.  We will try sildenafil, side effects extensively discussed.  See instructions. Pulmonary Nodules: Due for CT, states he is unable to proceed due to cost, no insurance Question of cirrhosis on CT 04-2016: Liver tests have always been normal months, no risk factors.  Observation Kidney stone: At the end of the visit he reported few months ago went [facility] with right-sided abdominal pain, nausea.  Workup was done, diagnosed with a kidney stone, was given a strainer, never passed a stone but quickly become asymptomatic.  Will get records. Flu shot today. RTC, fasting, 3 - 4 month CPX.

## 2017-04-11 NOTE — Telephone Encounter (Signed)
In MD red folder for review.

## 2017-04-11 NOTE — Telephone Encounter (Signed)
Received Medical records from [facility]; forwarded to provider/SLS 01/08

## 2017-04-13 NOTE — Telephone Encounter (Signed)
Records sent for scanning

## 2017-04-13 NOTE — Telephone Encounter (Signed)
Advised patient: I saw the records from the ER, no further eval is needed unless he developed any urinary problems, pain at the abdomen or the sides. ==== RECORDS: Went to the ER 02/2017, had RLQ pain, CBC essentially normal, creatinine 0.9, UA= RBCs; CT showed a 4 mm stone in the proximal right ureter with mild right hydronephrosis.  EKG normal, troponin negative. Was prescribed Diflucan for "yeast" in the urine;  since the stone was 1 mm and he pain quickly resolved I do not think further eval is needed at this point.  They also send other  records including right knee arthroscopy performed in 2001

## 2017-04-13 NOTE — Telephone Encounter (Signed)
LMOM informing of PCP recommendations. Instructed to call if questions/concerns.

## 2017-07-26 ENCOUNTER — Telehealth: Payer: Self-pay

## 2017-07-26 ENCOUNTER — Ambulatory Visit (INDEPENDENT_AMBULATORY_CARE_PROVIDER_SITE_OTHER): Payer: Self-pay | Admitting: Internal Medicine

## 2017-07-26 ENCOUNTER — Encounter: Payer: Self-pay | Admitting: Internal Medicine

## 2017-07-26 VITALS — BP 116/74 | HR 50 | Temp 97.6°F | Resp 16 | Ht 74.0 in | Wt 249.4 lb

## 2017-07-26 DIAGNOSIS — R739 Hyperglycemia, unspecified: Secondary | ICD-10-CM

## 2017-07-26 DIAGNOSIS — R918 Other nonspecific abnormal finding of lung field: Secondary | ICD-10-CM

## 2017-07-26 DIAGNOSIS — M1712 Unilateral primary osteoarthritis, left knee: Secondary | ICD-10-CM

## 2017-07-26 DIAGNOSIS — Z1159 Encounter for screening for other viral diseases: Secondary | ICD-10-CM

## 2017-07-26 DIAGNOSIS — Z114 Encounter for screening for human immunodeficiency virus [HIV]: Secondary | ICD-10-CM

## 2017-07-26 DIAGNOSIS — Z0000 Encounter for general adult medical examination without abnormal findings: Secondary | ICD-10-CM

## 2017-07-26 LAB — LIPID PANEL
Cholesterol: 184 mg/dL (ref 0–200)
HDL: 44.2 mg/dL (ref >39.00)
LDL CALC: 120 mg/dL — AB (ref 0–99)
NONHDL: 139.83
Total CHOL/HDL Ratio: 4
Triglycerides: 100 mg/dL (ref 0.0–149.0)
VLDL: 20 mg/dL (ref 0.0–40.0)

## 2017-07-26 LAB — COMPREHENSIVE METABOLIC PANEL
ALT: 30 U/L (ref 0–53)
AST: 23 U/L (ref 0–37)
Albumin: 4.3 g/dL (ref 3.5–5.2)
Alkaline Phosphatase: 80 U/L (ref 39–117)
BUN: 18 mg/dL (ref 6–23)
CHLORIDE: 103 meq/L (ref 96–112)
CO2: 31 meq/L (ref 19–32)
Calcium: 9.1 mg/dL (ref 8.4–10.5)
Creatinine, Ser: 0.82 mg/dL (ref 0.40–1.50)
GFR: 102.18 mL/min (ref >60.00)
Glucose, Bld: 104 mg/dL — ABNORMAL HIGH (ref 70–99)
POTASSIUM: 5 meq/L (ref 3.5–5.1)
SODIUM: 138 meq/L (ref 135–145)
Total Bilirubin: 0.7 mg/dL (ref 0.2–1.2)
Total Protein: 6.9 g/dL (ref 6.0–8.3)

## 2017-07-26 LAB — PSA: PSA: 1.69 ng/mL (ref 0.10–4.00)

## 2017-07-26 LAB — CBC WITH DIFFERENTIAL/PLATELET
BASOS PCT: 0.5 % (ref 0.0–3.0)
Basophils Absolute: 0 10*3/uL (ref 0.0–0.1)
EOS ABS: 0.1 10*3/uL (ref 0.0–0.7)
Eosinophils Relative: 1.9 % (ref 0.0–5.0)
HCT: 46.9 % (ref 39.0–52.0)
HEMOGLOBIN: 16 g/dL (ref 13.0–17.0)
Lymphocytes Relative: 31.7 % (ref 12.0–46.0)
Lymphs Abs: 1.7 10*3/uL (ref 0.7–4.0)
MCHC: 34.1 g/dL (ref 30.0–36.0)
MCV: 91.2 fl (ref 78.0–100.0)
MONO ABS: 0.5 10*3/uL (ref 0.1–1.0)
Monocytes Relative: 9.1 % (ref 3.0–12.0)
Neutro Abs: 3.1 10*3/uL (ref 1.4–7.7)
Neutrophils Relative %: 56.8 % (ref 43.0–77.0)
Platelets: 181 10*3/uL (ref 150.0–400.0)
RBC: 5.14 Mil/uL (ref 4.22–5.81)
RDW: 13.5 % (ref 11.5–15.5)
WBC: 5.4 10*3/uL (ref 4.0–10.5)

## 2017-07-26 LAB — TSH: TSH: 1.08 u[IU]/mL (ref 0.35–4.50)

## 2017-07-26 LAB — HEMOGLOBIN A1C: HEMOGLOBIN A1C: 6.4 % (ref 4.6–6.5)

## 2017-07-26 NOTE — Progress Notes (Signed)
Subjective:    Patient ID: Russell Mack, male    DOB: 02-21-1961, 57 y.o.   MRN: [id number]  DOS:  07/26/2017 Type of visit - description : cpx Interval history: No major concerns Wt Readings from Last 3 Encounters:  07/26/17 249 lb 6 oz (113.1 kg)  04/07/17 249 lb (112.9 kg)  03/29/16 253 lb (114.8 kg)     Review of Systems Complain of left knee pain on and off, mostly when he do a lot of walking at work or with certain positions at night.  Other than above, a 14 point review of systems is negative      Past Medical History:  Diagnosis Date  . GERD (gastroesophageal reflux disease)     Past Surgical History:  Procedure Laterality Date  . KNEE ARTHROSCOPY  remotely, side ?    Social History   Socioeconomic History  . Marital status: Married    Spouse name: Not on file  . Number of children: 2  . Years of education: Not on file  . Highest education level: Not on file  Occupational History  . Occupation: foreman  Social Needs  . Financial resource strain: Not on file  . Food insecurity:    Worry: Not on file    Inability: Not on file  . Transportation needs:    Medical: Not on file    Non-medical: Not on file  Tobacco Use  . Smoking status: Never Smoker  . Smokeless tobacco: Never Used  Substance and Sexual Activity  . Alcohol use: No    Alcohol/week: 0.0 oz  . Drug use: No  . Sexual activity: Not on file  Lifestyle  . Physical activity:    Days per week: Not on file    Minutes per session: Not on file  . Stress: Not on file  Relationships  . Social connections:    Talks on phone: Not on file    Gets together: Not on file    Attends religious service: Not on file    Active member of club or organization: Not on file    Attends meetings of clubs or organizations: Not on file    Relationship status: Not on file  . Intimate partner violence:    Fear of current or ex partner: Not on file    Emotionally abused: Not on file    Physically abused:  Not on file    Forced sexual activity: Not on file  Other Topics Concern  . Not on file  Social History Narrative   2 adult children, 2 G-kids     Family History  Problem Relation Age of Onset  . Diabetes Mother        mother, 2 sisters, aunts   . CAD Mother        CABG at age ~ 53s  . CAD Sister        3 sibling had aCABG, 1  a stent ~late 89s  . Colon cancer Neg Hx   . Prostate cancer Neg Hx     Allergies as of 07/26/2017   No Known Allergies     Medication List        Accurate as of 07/26/17  2:06 PM. Always use your most recent med list.          aspirin 81 MG tablet Take 81 mg by mouth daily.   escitalopram 10 MG tablet Commonly known as:  LEXAPRO Take 1 tablet (10 mg total) by mouth daily.   NEXIUM  PO Take 1 tablet by mouth daily.   sildenafil 20 MG tablet Commonly known as:  REVATIO Take 3-4 tablets (60-80 mg total) by mouth at bedtime as needed.          Objective:   Physical Exam BP 116/74 (BP Location: Left Arm, Patient Position: Sitting, Cuff Size: Normal)   Pulse (!) 50   Temp 97.6 F (36.4 C) (Oral)   Resp 16   Ht 6\' 2"  (1.88 m)   Wt 249 lb 6 oz (113.1 kg)   SpO2 96%   BMI 32.02 kg/m  General:   Well developed, well nourished . NAD.  Neck: No  thyromegaly  HEENT:  Normocephalic . Face symmetric, atraumatic Lungs:  CTA B Normal respiratory effort, no intercostal retractions, no accessory muscle use. Heart: RRR,  no murmur.  No pretibial edema bilaterally  Abdomen:  Not distended, soft, non-tender. No rebound or rigidity.   Skin: Exposed areas without rash. Not pale. Not jaundice Rectal: External abnormalities: none. Normal sphincter tone. No rectal masses or tenderness.  Brown stools Prostate: Prostate gland firm and smooth, mL enlargement; no nodularity, tenderness, mass, asymmetry or induration MSK: Knees without effusion, redness.  He does have some bony deformities mostly on the left consistent with DJD Neurologic:    alert & oriented X3.  Speech normal, gait appropriate for age and unassisted Strength symmetric and appropriate for age.  Psych: Cognition and judgment appear intact.  Cooperative with normal attention span and concentration.  Behavior appropriate. No anxious or depressed appearing.     Assessment & Plan:   Assessment Anxiety-- started SSRIs 1- 2016 excellent response Dyslipidemia: lipitor  self d/c d/t pain + FH CAD GERD Palpitations- saw cards, 12-2014: ETT low risk , had a Ca+ Coronary score Pulmonary nodules: - Incidental finding during a calcium coronary scan (0-4540(12-2014) -  CT 04/2016: +nodules, recommend additional CT in 1 year.  Incidentally question of cirrhosis and a 4 mm R renal stone E.D.  PLAN   Anxiety:Continue SSRIs Lung nodules: Schedule a CT chest without, cost  might be an issue. Hyperglycemia: A1c was 6.0.  Recheck today DJD: Mostly pain at the left knee, exam show bony prominence on the left, likely DJD.  We will get a x-ray, for now recommend conservative treatment.  Will call when/if ready for a referral RTC 1 year

## 2017-07-26 NOTE — Patient Instructions (Signed)
GO TO THE LAB : Get the blood work     GO TO THE FRONT DESK Schedule your next appointment   for a physical exam in 1 year 

## 2017-07-26 NOTE — Assessment & Plan Note (Signed)
-  Td 2013 -[facility] colonoscopy 2012 per [facility], no records, was done in [place], told 2 polyps, next Cscope ? Had a EGD at the same time Plan: IFOB,  get records Dr Loman Chromanhoton -DRE today essentially normal with a slight increased prostate size, check a PSA - diet : room for improvement, very active.  Counseled about diet. -Labs: CMP, FLP, CBC, A1c, TSH, PSA, HIV hepc

## 2017-07-26 NOTE — Progress Notes (Signed)
Pre visit review using our clinic review tool, if applicable. No additional management support is needed unless otherwise documented below in the visit note. 

## 2017-07-26 NOTE — Telephone Encounter (Signed)
ROI faxed to Dr. Loman Chromanhoton at [phone]. ROI sent for scanning. Awaiting medical records.

## 2017-07-26 NOTE — Assessment & Plan Note (Signed)
Anxiety:Continue SSRIs Lung nodules: Schedule a CT chest without, cost  might be an issue. Hyperglycemia: A1c was 6.0.  Recheck today DJD: Mostly pain at the left knee, exam show bony prominence on the left, likely DJD.  We will get a x-ray, for now recommend conservative treatment.  Will call when/if ready for a referral RTC 1 year

## 2017-07-27 LAB — HEPATITIS C ANTIBODY
HEP C AB: NONREACTIVE
SIGNAL TO CUT-OFF: 0.01 (ref <1.00)

## 2017-07-27 LAB — HIV ANTIBODY (ROUTINE TESTING W REFLEX): HIV 1&2 Ab, 4th Generation: NONREACTIVE

## 2017-07-28 ENCOUNTER — Other Ambulatory Visit (HOSPITAL_BASED_OUTPATIENT_CLINIC_OR_DEPARTMENT_OTHER): Payer: Self-pay

## 2017-07-28 ENCOUNTER — Ambulatory Visit (HOSPITAL_BASED_OUTPATIENT_CLINIC_OR_DEPARTMENT_OTHER): Payer: Self-pay

## 2017-07-30 ENCOUNTER — Ambulatory Visit (HOSPITAL_BASED_OUTPATIENT_CLINIC_OR_DEPARTMENT_OTHER)
Admission: RE | Admit: 2017-07-30 | Discharge: 2017-07-30 | Disposition: A | Discharge status: Home / Self Care | Payer: Self-pay | Source: Ambulatory Visit | Attending: Internal Medicine | Admitting: Internal Medicine

## 2017-07-30 DIAGNOSIS — R918 Other nonspecific abnormal finding of lung field: Secondary | ICD-10-CM | POA: Insufficient documentation

## 2017-07-30 DIAGNOSIS — I2510 Atherosclerotic heart disease of native coronary artery without angina pectoris: Secondary | ICD-10-CM | POA: Insufficient documentation

## 2017-07-30 DIAGNOSIS — M1712 Unilateral primary osteoarthritis, left knee: Secondary | ICD-10-CM | POA: Insufficient documentation

## 2017-08-01 NOTE — Telephone Encounter (Signed)
Received Medical records from [facility]; forwarded to provider/SLS 04/30

## 2017-08-09 ENCOUNTER — Encounter: Payer: Self-pay | Admitting: Internal Medicine

## 2018-03-18 ENCOUNTER — Other Ambulatory Visit: Payer: Self-pay | Admitting: Internal Medicine

## 2018-05-14 ENCOUNTER — Ambulatory Visit: Payer: Self-pay | Admitting: *Deleted

## 2018-05-14 MED ORDER — OSELTAMIVIR PHOSPHATE 75 MG PO CAPS: 75.0000 mg | ORAL_CAPSULE | Freq: Every day | ORAL | 0 refills | Status: DC

## 2018-05-14 NOTE — Addendum Note (Signed)
Addended byConrad [place] D on: 05/14/2018 04:57 PM   Modules accepted: Orders

## 2018-05-14 NOTE — Telephone Encounter (Signed)
Send Tamiflu 75 mg 1 tablet daily #10 no refills

## 2018-05-14 NOTE — Telephone Encounter (Signed)
Please advise 

## 2018-05-14 NOTE — Telephone Encounter (Signed)
Rx sent. Spoke w/ [name], Pt's wife, informed Rx sent- however if Pt starts w/ symptoms he will need to be seen in office. Tonya verbalized understanding.

## 2018-05-14 NOTE — Telephone Encounter (Signed)
Patient's wife called in for him. They have a 60 year old granddaughter that lives with them and she was diagnosed with the flu yesterday. He is not having any symptoms and is at work today. Requested a prophylactic dose to be called in to the pharmacy on file.  Routing to PCP for advice.   Reason for Disposition . [1] Influenza EXPOSURE within last 48 hours (2 days) AND [2] NOT HIGH RISK AND [3] strongly requests antiviral medication  Answer Assessment - Initial Assessment Questions 1. TYPE of EXPOSURE: "How were you exposed?" (e.g., close contact, not a close contact)     2 yr old granddaughter lives with him and his wife. She was diagnosed with the flu yesterday. 2. DATE of EXPOSURE: "When did the exposure occur?" (e.g., hour, days, weeks)     Over the weekend.  3. PREGNANCY: "Is there any chance you are pregnant?" "When was your last menstrual period?"     na 4. HIGH RISK for COMPLICATIONS: "Do you have any heart or lung problems? Do you have a weakened immune system?" (e.g., CHF, COPD, asthma, HIV positive, chemotherapy, renal failure, diabetes mellitus, sickle cell anemia)     no 5. SYMPTOMS: "Do you have any symptoms?" (e.g., cough, fever, sore throat, difficulty breathing).     None  Protocols used: INFLUENZA EXPOSURE-A-AH

## 2018-08-27 IMAGING — CR DG KNEE COMPLETE 4+V*L*
4 series · 4 of 4 positions shown · non-contrast
Comparison: None in PACs

CLINICAL DATA: Left lateral knee pain with no known injury.
Possible knot over the lateral aspect of the knee.

EXAM:
LEFT KNEE - COMPLETE 4+ VIEW

[t knee ap left]
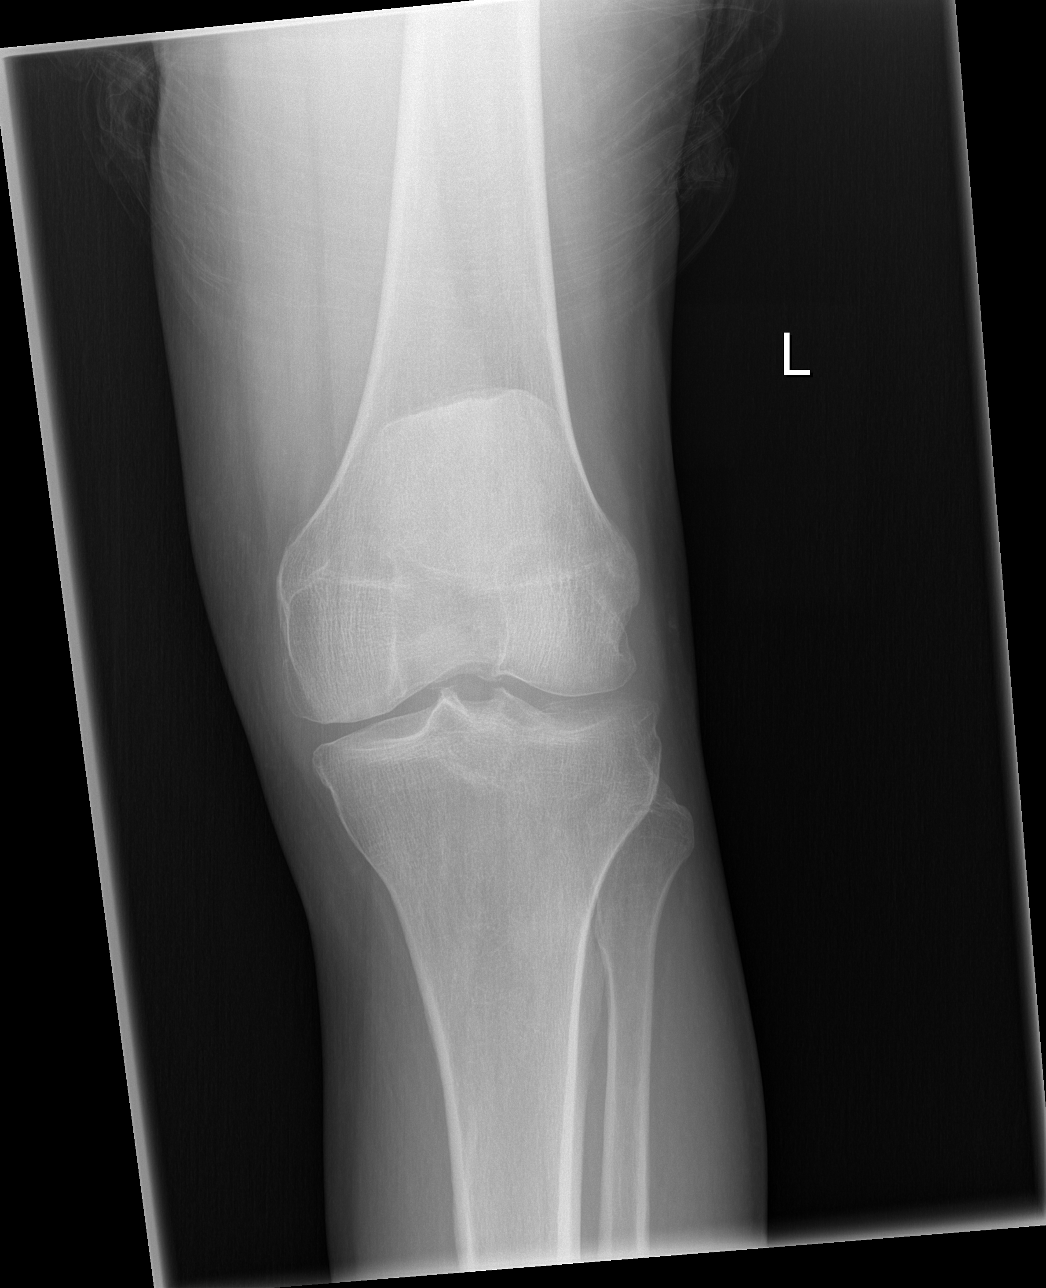

[t knee oblique left (1 of 2)]
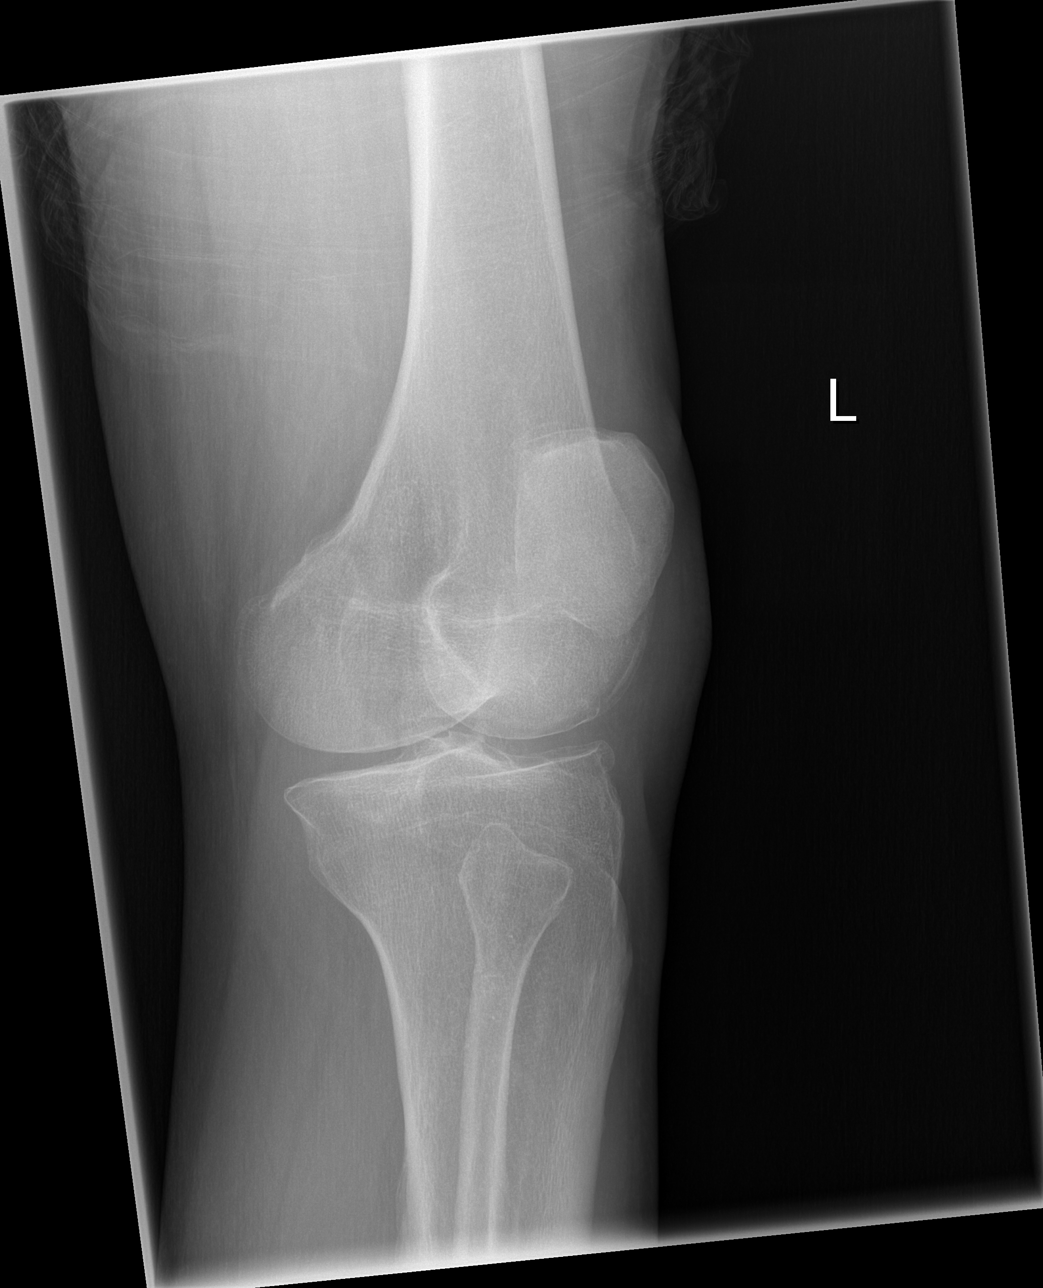

[t knee oblique left (2 of 2)]
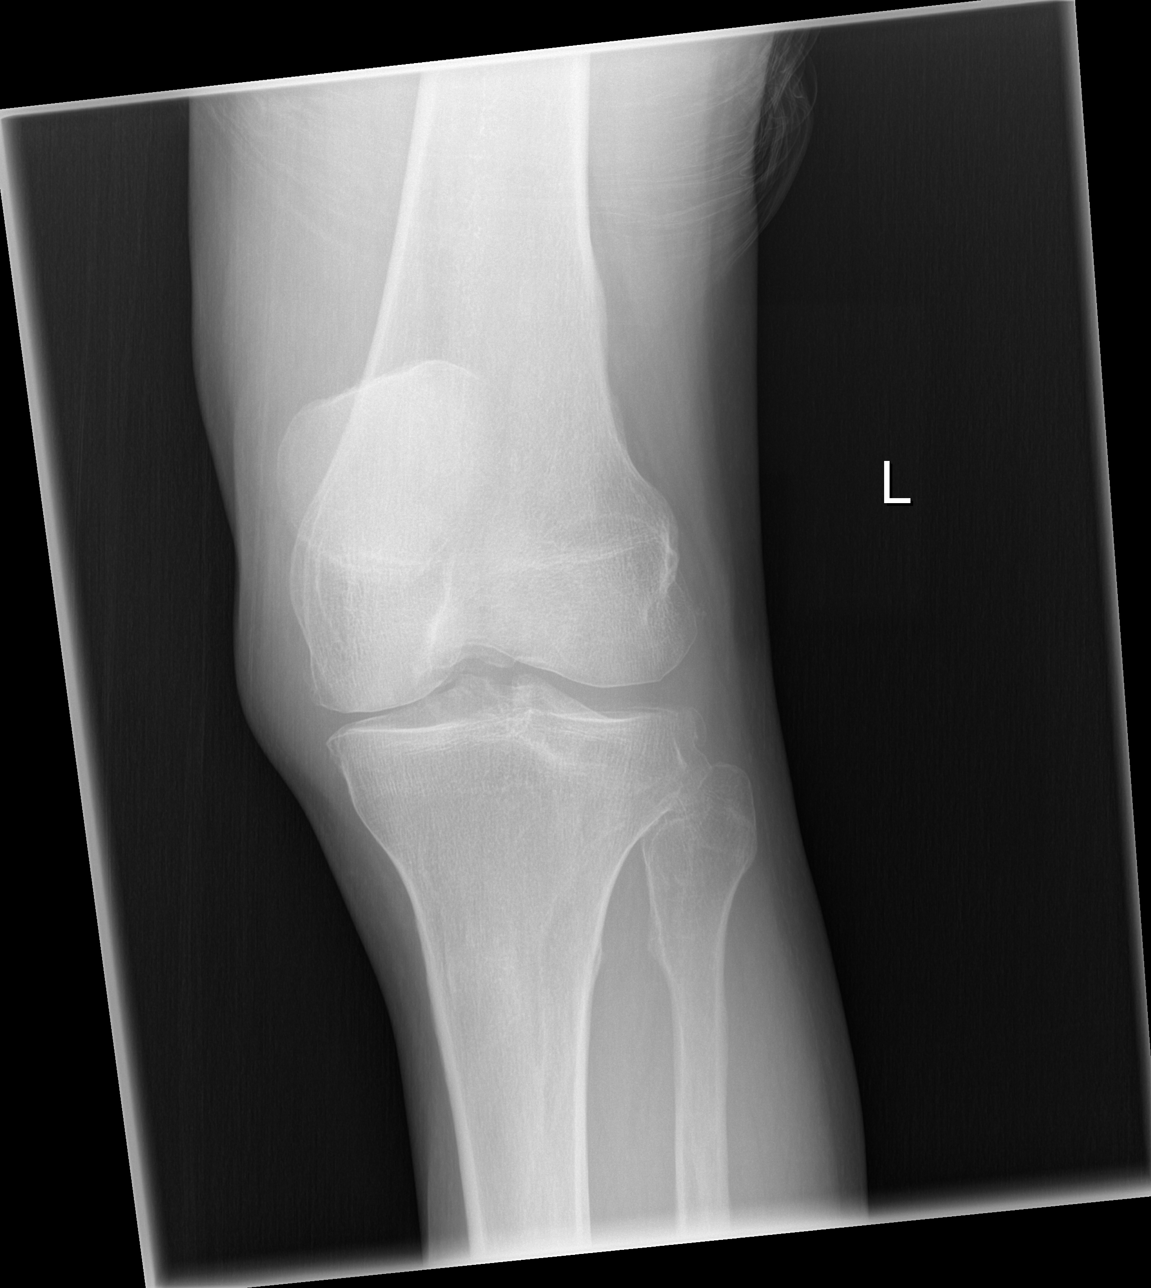

[t knee lat left]
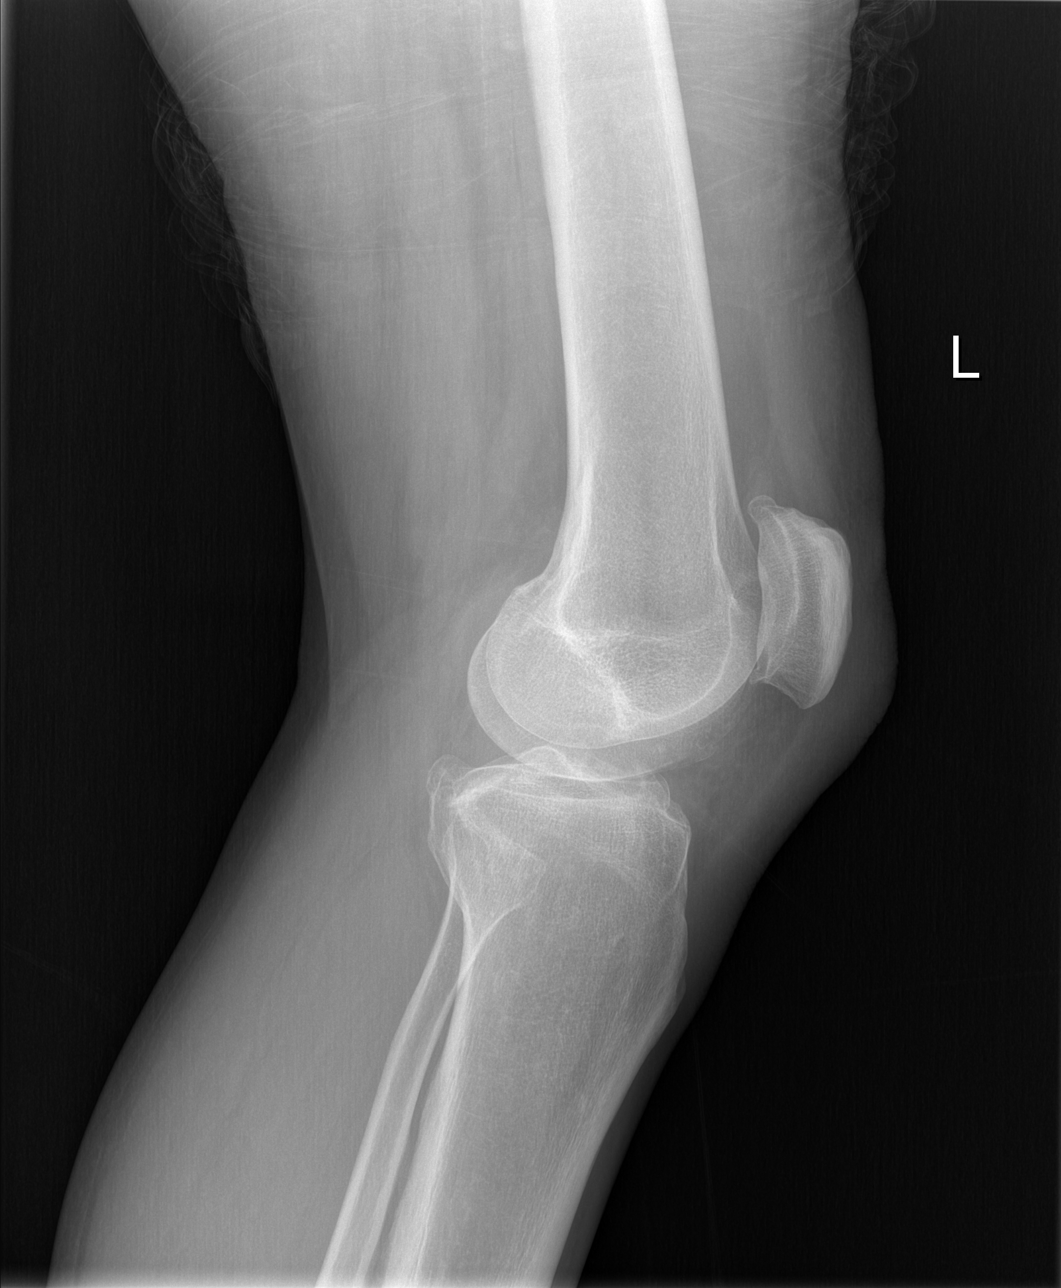

[4 of 4 positions shown; findings below may reference images not displayed]

FINDINGS: The bones are subjectively adequately mineralized. There is minimal
narrowing of the medial joint space. There is beaking of the tibial
spines. Spurs arise from the articular margins of the patella. There
is no joint effusion.
IMPRESSION: Mild osteoarthritic change of the left knee involving principally
the medial and patellofemoral compartments. No acute bony
abnormality.

## 2018-10-20 ENCOUNTER — Other Ambulatory Visit: Payer: Self-pay | Admitting: Internal Medicine

## 2018-11-07 ENCOUNTER — Encounter: Payer: Self-pay | Admitting: Internal Medicine

## 2018-11-16 ENCOUNTER — Other Ambulatory Visit: Payer: Self-pay | Admitting: Internal Medicine

## 2019-01-03 ENCOUNTER — Other Ambulatory Visit: Payer: Self-pay | Admitting: Internal Medicine

## 2019-01-03 MED ORDER — ESCITALOPRAM OXALATE 10 MG PO TABS: 10.0000 mg | ORAL_TABLET | Freq: Every day | ORAL | 0 refills | Status: DC

## 2019-01-03 NOTE — Telephone Encounter (Signed)
rx refill escitalopram (LEXAPRO) 10 MG tablet  [place], [place] [phone] (Phone) [phone] (Fax)    Patient states he has an appt on 10/5.  Patient does not have any more medication and is wondering if he can get some until his appt

## 2019-01-03 NOTE — Telephone Encounter (Signed)
15 day supply sent.  

## 2019-01-07 ENCOUNTER — Encounter: Payer: Self-pay | Admitting: Internal Medicine

## 2019-01-07 ENCOUNTER — Ambulatory Visit: Payer: Managed Care, Other (non HMO) | Admitting: Internal Medicine

## 2019-01-07 ENCOUNTER — Other Ambulatory Visit: Payer: Self-pay

## 2019-01-07 VITALS — BP 110/66 | HR 65 | Temp 97.5°F | Resp 16 | Ht 74.0 in | Wt 238.5 lb

## 2019-01-07 DIAGNOSIS — F419 Anxiety disorder, unspecified: Secondary | ICD-10-CM

## 2019-01-07 DIAGNOSIS — N529 Male erectile dysfunction, unspecified: Secondary | ICD-10-CM | POA: Diagnosis not present

## 2019-01-07 DIAGNOSIS — R739 Hyperglycemia, unspecified: Secondary | ICD-10-CM

## 2019-01-07 DIAGNOSIS — E785 Hyperlipidemia, unspecified: Secondary | ICD-10-CM | POA: Diagnosis not present

## 2019-01-07 DIAGNOSIS — Z23 Encounter for immunization: Secondary | ICD-10-CM

## 2019-01-07 LAB — LIPID PANEL
Cholesterol: 152 mg/dL (ref 0–200)
HDL: 44.6 mg/dL (ref >39.00)
LDL Cholesterol: 95 mg/dL (ref 0–99)
NonHDL: 107.06
Total CHOL/HDL Ratio: 3
Triglycerides: 58 mg/dL (ref 0.0–149.0)
VLDL: 11.6 mg/dL (ref 0.0–40.0)

## 2019-01-07 LAB — COMPREHENSIVE METABOLIC PANEL
ALT: 28 U/L (ref 0–53)
AST: 20 U/L (ref 0–37)
Albumin: 4.3 g/dL (ref 3.5–5.2)
Alkaline Phosphatase: 81 U/L (ref 39–117)
BUN: 15 mg/dL (ref 6–23)
CO2: 28 mEq/L (ref 19–32)
Calcium: 9.1 mg/dL (ref 8.4–10.5)
Chloride: 105 mEq/L (ref 96–112)
Creatinine, Ser: 0.73 mg/dL (ref 0.40–1.50)
GFR: 109.4 mL/min (ref >60.00)
Glucose, Bld: 90 mg/dL (ref 70–99)
Potassium: 4.6 mEq/L (ref 3.5–5.1)
Sodium: 139 mEq/L (ref 135–145)
Total Bilirubin: 0.4 mg/dL (ref 0.2–1.2)
Total Protein: 6.5 g/dL (ref 6.0–8.3)

## 2019-01-07 LAB — HEMOGLOBIN A1C: Hgb A1c MFr Bld: 6.2 % (ref 4.6–6.5)

## 2019-01-07 MED ORDER — DULOXETINE HCL 30 MG PO CPEP: 30.0000 mg | ORAL_CAPSULE | Freq: Every day | ORAL | 3 refills | Status: AC

## 2019-01-07 MED ORDER — SILDENAFIL CITRATE 20 MG PO TABS: 60.0000 mg | ORAL_TABLET | Freq: Every evening | ORAL | 3 refills | Status: AC | PRN

## 2019-01-07 NOTE — Patient Instructions (Signed)
GO TO THE LAB : Get the blood work     GO TO THE FRONT DESK Schedule your next appointment for a physical exam in 4 to 5 weeks  Stop Lexapro  Start Cymbalta

## 2019-01-07 NOTE — Progress Notes (Signed)
Subjective:    Patient ID: Russell Mack, male    DOB: 1960/04/12, 58 y.o.   MRN: [id number]  DOS:  01/07/2019 Type of visit - description: Follow-up Prediabetes: Due for labs Anxiety depression: On Lexapro for years, symptoms well controlled Erectile dysfunction: Having issues with erectile dysfunction and ejaculation over the last year.  Wonders if   due to Lexapro    Review of Systems Denies chest pain no difficulty breathing No palpitations No claudication  Past Medical History:  Diagnosis Date  . GERD (gastroesophageal reflux disease)     Past Surgical History:  Procedure Laterality Date  . KNEE ARTHROSCOPY  remotely, side ?    Social History   Socioeconomic History  . Marital status: Married    Spouse name: Not on file  . Number of children: 2  . Years of education: Not on file  . Highest education level: Not on file  Occupational History  . Occupation: foreman  Social Needs  . Financial resource strain: Not on file  . Food insecurity    Worry: Not on file    Inability: Not on file  . Transportation needs    Medical: Not on file    Non-medical: Not on file  Tobacco Use  . Smoking status: Never Smoker  . Smokeless tobacco: Never Used  Substance and Sexual Activity  . Alcohol use: No    Alcohol/week: 0.0 standard drinks  . Drug use: No  . Sexual activity: Not on file  Lifestyle  . Physical activity    Days per week: Not on file    Minutes per session: Not on file  . Stress: Not on file  Relationships  . Social Herbalist on phone: Not on file    Gets together: Not on file    Attends religious service: Not on file    Active member of club or organization: Not on file    Attends meetings of clubs or organizations: Not on file    Relationship status: Not on file  . Intimate partner violence    Fear of current or ex partner: Not on file    Emotionally abused: Not on file    Physically abused: Not on file    Forced sexual activity: Not  on file  Other Topics Concern  . Not on file  Social History Narrative   2 adult children, 2 G-kids      Allergies as of 01/07/2019   No Known Allergies     Medication List       Accurate as of January 07, 2019 11:24 AM. If you have any questions, ask your nurse or doctor.        aspirin 81 MG tablet Take 81 mg by mouth daily.   escitalopram 10 MG tablet Commonly known as: LEXAPRO Take 1 tablet (10 mg total) by mouth daily.   NEXIUM PO Take 1 tablet by mouth daily.   sildenafil 20 MG tablet Commonly known as: REVATIO Take 3-4 tablets (60-80 mg total) by mouth at bedtime as needed.           Objective:   Physical Exam BP 110/66 (BP Location: Left Arm, Patient Position: Sitting, Cuff Size: Normal)   Pulse 65   Temp (!) 97.5 F (36.4 C) (Temporal)   Resp 16   Ht 6\' 2"  (1.88 m)   Wt 238 lb 8 oz (108.2 kg)   SpO2 98%   BMI 30.62 kg/m   General:   Well developed,  NAD, BMI noted. HEENT:  Normocephalic . Face symmetric, atraumatic Lungs:  CTA B Normal respiratory effort, no intercostal retractions, no accessory muscle use. Heart: RRR,  no murmur.  No pretibial edema bilaterally  Skin: Not pale. Not jaundice Neurologic:  alert & oriented X3.  Speech normal, gait appropriate for age and unassisted Psych--  Cognition and judgment appear intact.  Cooperative with normal attention span and concentration.  Behavior appropriate. No anxious or depressed appearing.      Assessment     Assessment Anxiety-- started SSRIs 1- 2016 excellent response Dyslipidemia: lipitor  self d/c d/t pain + FH CAD GERD Palpitations- saw cards, 12-2014: ETT low risk , had a Ca+ Coronary score Pulmonary nodules: - Incidental finding during a calcium coronary scan ([id number]) -  CT 04/2016: +nodules, recommend additional CT in 1 year.  Incidentally question of cirrhosis and a 4 mm R renal stone E.D.  PLAN  Anxiety: On Lexapro since 2016, excellent reports.  Reports difficulties  with sexual function over the last year and wonders if it is due to Lexapro. Options:   switch to another SSRI-SNRI , add Wellbutrin or continue with the same treatment and Viagra prn Patient would like to switch, we agreed to change to Cymbalta 30 mg, consider increase to 60.  Will call if s/e ED: Continue Viagra, see above Hyperglycemia: Last A1c 6.4, checking a CMP, FLP, A1c Flu shot today RTC 4 to 5 weeks CPX

## 2019-01-07 NOTE — Progress Notes (Signed)
Pre visit review using our clinic review tool, if applicable. No additional management support is needed unless otherwise documented below in the visit note. 

## 2019-01-08 ENCOUNTER — Telehealth: Payer: Self-pay

## 2019-01-08 NOTE — Telephone Encounter (Signed)
PA initiated via Covermymeds; KEY: [id number]. Awaiting determination.

## 2019-01-09 DIAGNOSIS — N529 Male erectile dysfunction, unspecified: Secondary | ICD-10-CM | POA: Insufficient documentation

## 2019-01-09 NOTE — Assessment & Plan Note (Signed)
Anxiety: On Lexapro since 2016, excellent reports.  Reports difficulties with sexual function over the last year and wonders if it is due to Lexapro. Options:   switch to another SSRI-SNRI , add Wellbutrin or continue with the same treatment and Viagra prn Patient would like to switch, we agreed to change to Cymbalta 30 mg, consider increase to 60.  Will call if s/e ED: Continue Viagra, see above Hyperglycemia: Last A1c 6.4, checking a CMP, FLP, A1c Flu shot today RTC 4 to 5 weeks CPX

## 2019-01-09 NOTE — Telephone Encounter (Signed)
PA denied. Coverage for Revatio suspension or 20mg  tablets or sildenafil 20mg  tablets are only approvable for the following dx: pulmonary arterial hypertension or secondary Raynauds phenomenon.

## 2019-02-06 ENCOUNTER — Encounter: Payer: Managed Care, Other (non HMO) | Admitting: Internal Medicine

## 2020-06-11 ENCOUNTER — Encounter: Payer: Self-pay | Admitting: Internal Medicine

## 2021-06-02 ENCOUNTER — Encounter: Payer: Self-pay | Admitting: Internal Medicine
